# Patient Record
Sex: Male | Born: 1955 | Race: Black or African American | Hispanic: No | Marital: Married | Smoking: Never smoker
Health system: Southern US, Community
[De-identification: ages and names within clinical notes are randomized; demographics above are authoritative.]

## PROBLEM LIST (undated history)

## (undated) DIAGNOSIS — I10 Essential (primary) hypertension: Secondary | ICD-10-CM

---

## 2014-02-17 ENCOUNTER — Other Ambulatory Visit (HOSPITAL_COMMUNITY): Payer: Self-pay | Admitting: Internal Medicine

## 2014-02-17 ENCOUNTER — Ambulatory Visit
Admission: RE | Admit: 2014-02-17 | Discharge: 2014-02-17 | Disposition: A | Discharge status: Home / Self Care | Payer: BC Managed Care – PPO | Source: Ambulatory Visit | Attending: Internal Medicine | Admitting: Internal Medicine

## 2014-02-17 ENCOUNTER — Other Ambulatory Visit: Payer: Self-pay | Admitting: Internal Medicine

## 2014-02-17 DIAGNOSIS — R109 Unspecified abdominal pain: Secondary | ICD-10-CM

## 2014-02-17 DIAGNOSIS — R1084 Generalized abdominal pain: Secondary | ICD-10-CM

## 2014-02-17 DIAGNOSIS — K759 Inflammatory liver disease, unspecified: Secondary | ICD-10-CM

## 2014-02-17 DIAGNOSIS — R7989 Other specified abnormal findings of blood chemistry: Secondary | ICD-10-CM

## 2014-02-17 MED ORDER — IOHEXOL 300 MG/ML  SOLN
100.0000 mL | Freq: Once | INTRAMUSCULAR | Status: AC | PRN
  Administered 2014-02-17: 100 mL via INTRAVENOUS

## 2014-02-18 ENCOUNTER — Ambulatory Visit (HOSPITAL_COMMUNITY)
Admission: RE | Admit: 2014-02-18 | Discharge: 2014-02-18 | Disposition: A | Discharge status: Home / Self Care | Payer: BC Managed Care – PPO | Source: Ambulatory Visit | Attending: Internal Medicine | Admitting: Internal Medicine

## 2014-02-18 ENCOUNTER — Ambulatory Visit (HOSPITAL_COMMUNITY): Payer: Self-pay

## 2014-02-18 ENCOUNTER — Ambulatory Visit (HOSPITAL_COMMUNITY): Payer: BC Managed Care – PPO

## 2014-02-18 DIAGNOSIS — K7689 Other specified diseases of liver: Secondary | ICD-10-CM | POA: Insufficient documentation

## 2014-02-18 DIAGNOSIS — R109 Unspecified abdominal pain: Secondary | ICD-10-CM | POA: Insufficient documentation

## 2014-02-18 DIAGNOSIS — N281 Cyst of kidney, acquired: Secondary | ICD-10-CM | POA: Insufficient documentation

## 2014-02-18 DIAGNOSIS — N200 Calculus of kidney: Secondary | ICD-10-CM | POA: Insufficient documentation

## 2014-02-18 DIAGNOSIS — R7989 Other specified abnormal findings of blood chemistry: Secondary | ICD-10-CM

## 2014-02-18 DIAGNOSIS — K759 Inflammatory liver disease, unspecified: Secondary | ICD-10-CM

## 2014-12-26 IMAGING — US US ABDOMEN COMPLETE
1 series · 13 of 25 positions shown · non-contrast
Comparison: 02/17/2014

CLINICAL DATA: Abdominal pain, hepatitis

EXAM:
ULTRASOUND ABDOMEN COMPLETE

[Series 1: us abdomen complete · 0.24mm/px · 13 of 114 slices shown]
[im 1/114]
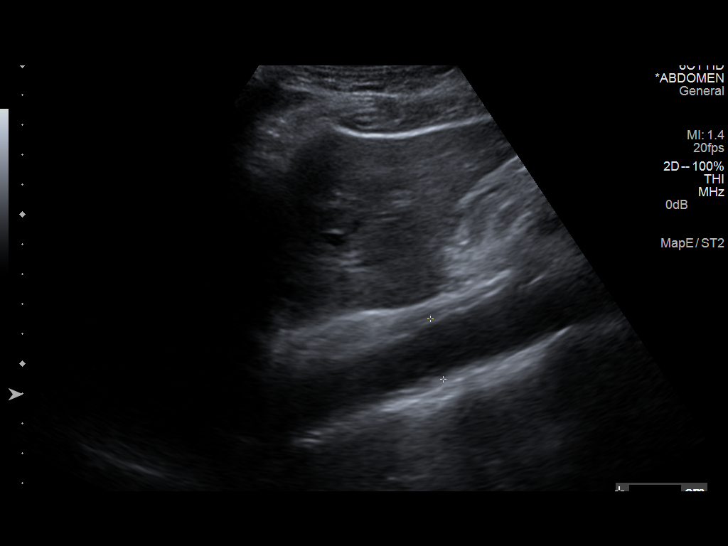
[im 10/114]
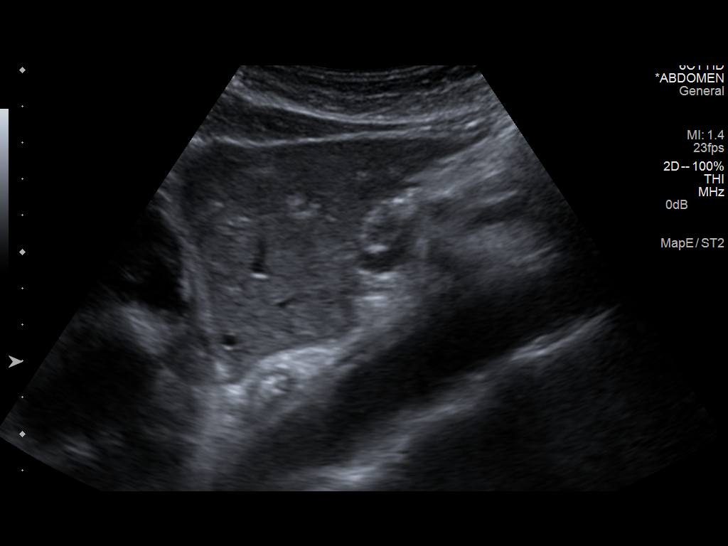
[im 19/114]
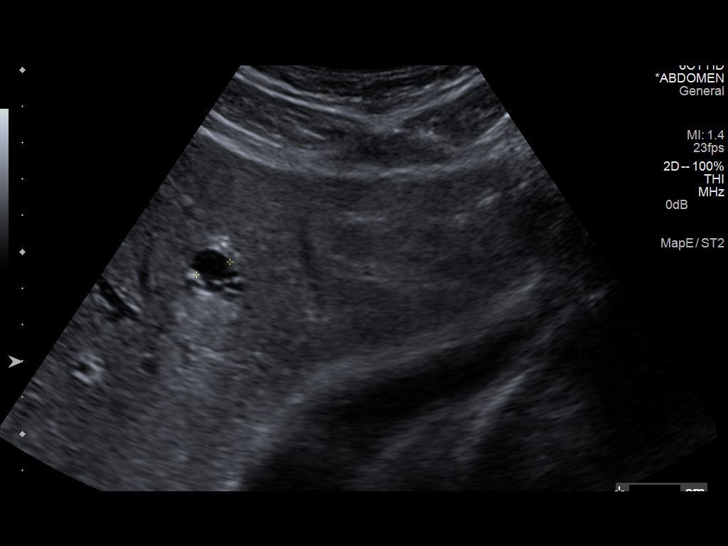
[im 29/114]
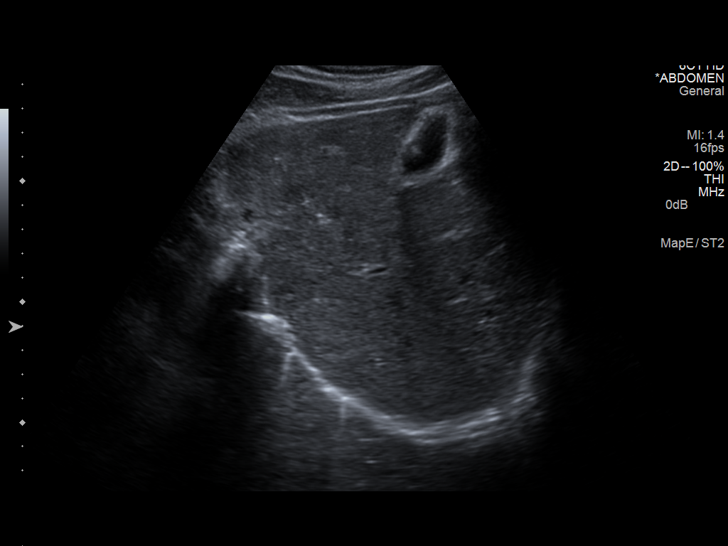
[im 38/114]
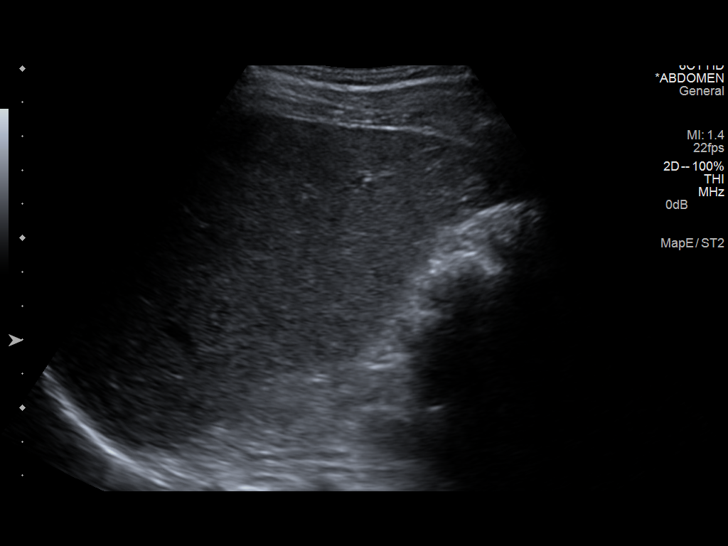
[im 48/114]
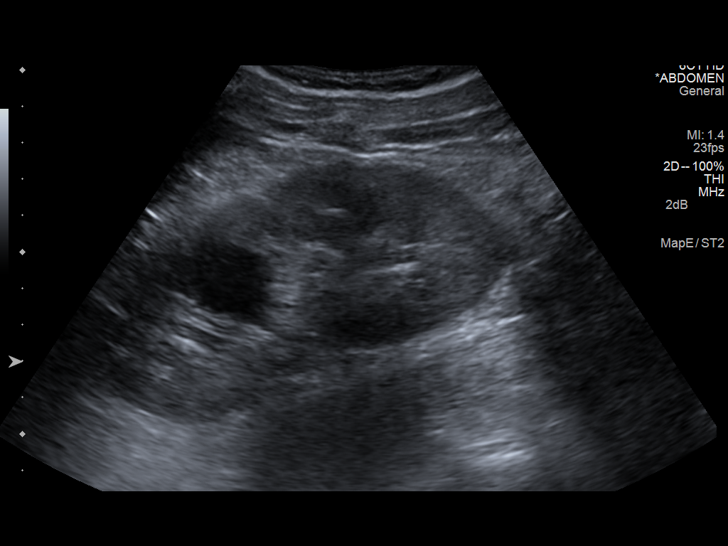
[im 57/114]
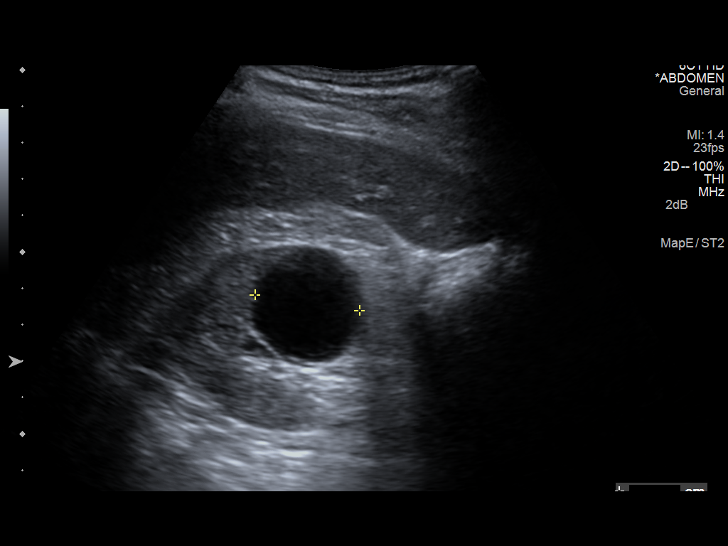
[im 66/114]
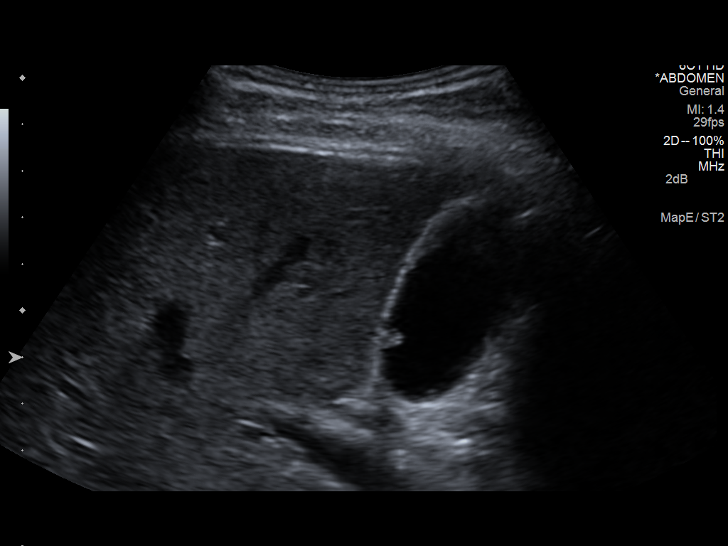
[im 76/114]
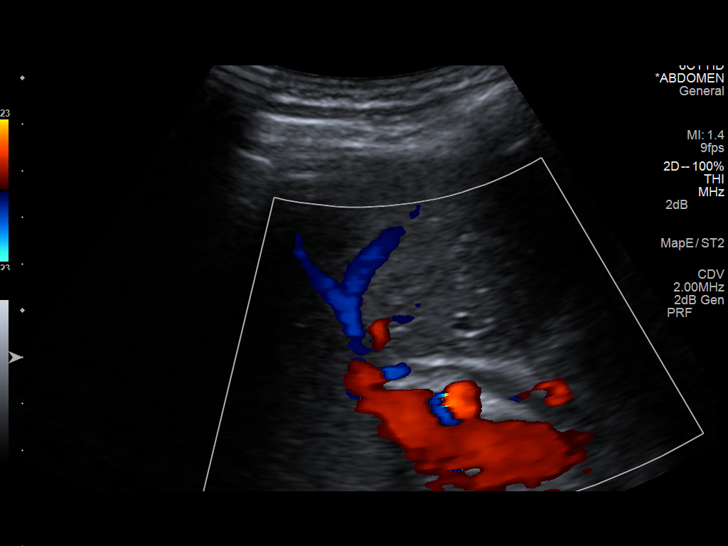
[im 85/114]
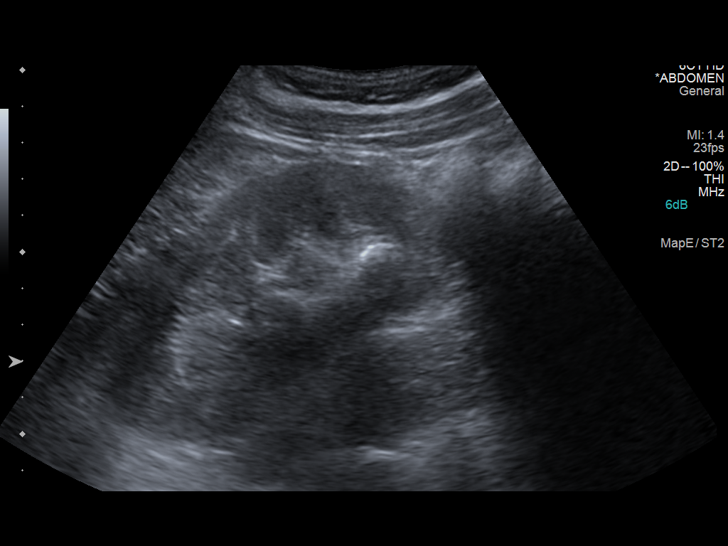
[im 95/114]
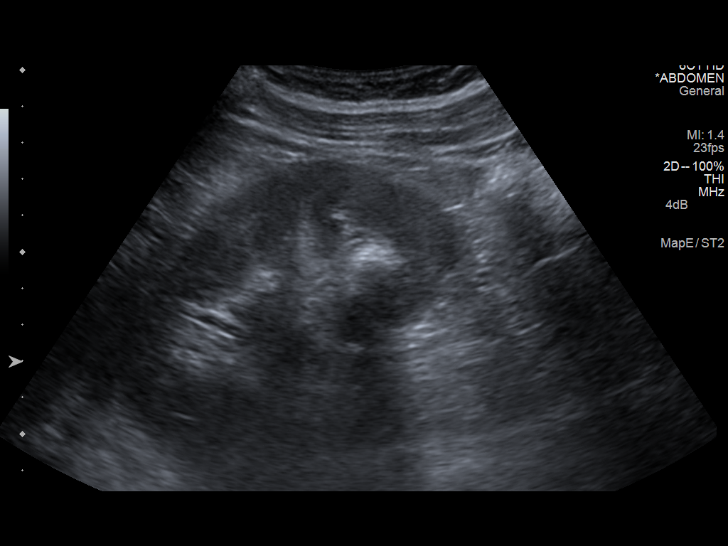
[im 104/114]
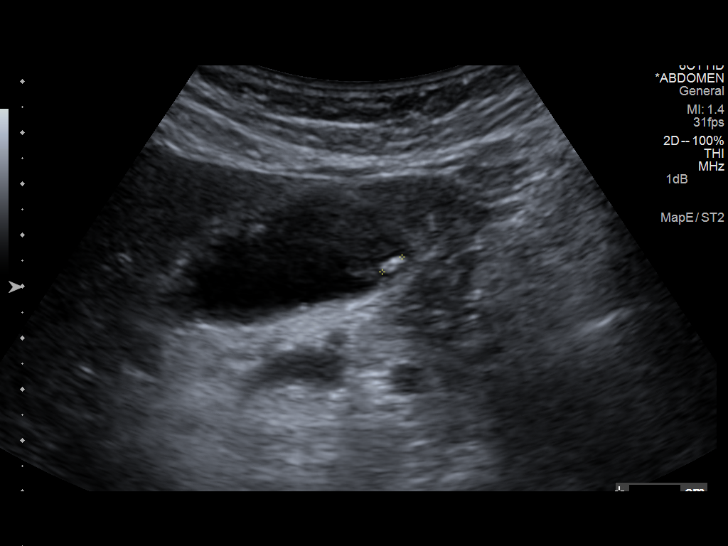
[im 114/114]
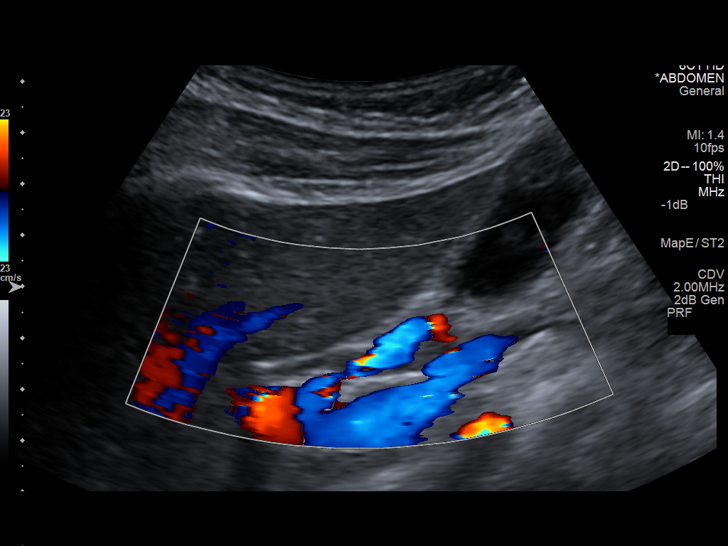

[13 of 25 positions shown; findings below may reference images not displayed]

FINDINGS: Gallbladder:

Incidental echogenic nonshadowing gallbladder polyps noted. Also,
there are small gallstones measuring 5 mm or less in size. Wall
thickness normal measuring 2.1 mm. No Murphy's sign or evidence of
acute cholecystitis by ultrasound.

Common bile duct:

Diameter: 5 mm

Liver:

Scattered hepatic hypoechoic cysts noted, largest measures 1.3 cm in
the left hepatic lobe. No biliary dilatation.

IVC:

No abnormality visualized.

Pancreas:

Visualized portion unremarkable.

Spleen:

Size and appearance within normal limits.

Right Kidney:

Length: 11.8 cm. Right upper pole hypoechoic renal cyst noted
measures 3.4 cm. No renal obstruction or hydronephrosis. No other
focal abnormality. Normal echogenicity.

Left Kidney:

Length: 11.4 cm . Echogenicity within normal limits. No mass or
hydronephrosis visualized. Left kidney lower pole nonobstructing
shadowing renal calculus noted measuring 11 mm, stable compared to
the prior CT.

Abdominal aorta:

No aneurysm visualized.

Other findings:

None.
IMPRESSION: Incidental gallbladder polyps and small gallstones. No ultrasound
evidence of acute cholecystitis.

No biliary dilatation

Hepatic and right renal cysts

Left nephrolithiasis

## 2016-05-06 DIAGNOSIS — N390 Urinary tract infection, site not specified: Secondary | ICD-10-CM | POA: Diagnosis not present

## 2016-05-06 DIAGNOSIS — Z0000 Encounter for general adult medical examination without abnormal findings: Secondary | ICD-10-CM | POA: Diagnosis not present

## 2016-05-13 DIAGNOSIS — Z0000 Encounter for general adult medical examination without abnormal findings: Secondary | ICD-10-CM | POA: Diagnosis not present

## 2016-05-13 DIAGNOSIS — I10 Essential (primary) hypertension: Secondary | ICD-10-CM | POA: Diagnosis not present

## 2016-08-26 DIAGNOSIS — M1712 Unilateral primary osteoarthritis, left knee: Secondary | ICD-10-CM | POA: Diagnosis not present

## 2016-08-26 DIAGNOSIS — M1711 Unilateral primary osteoarthritis, right knee: Secondary | ICD-10-CM | POA: Diagnosis not present

## 2017-06-02 DIAGNOSIS — I10 Essential (primary) hypertension: Secondary | ICD-10-CM | POA: Diagnosis not present

## 2017-06-02 DIAGNOSIS — Z0000 Encounter for general adult medical examination without abnormal findings: Secondary | ICD-10-CM | POA: Diagnosis not present

## 2017-06-02 DIAGNOSIS — Z125 Encounter for screening for malignant neoplasm of prostate: Secondary | ICD-10-CM | POA: Diagnosis not present

## 2017-06-09 DIAGNOSIS — Z0000 Encounter for general adult medical examination without abnormal findings: Secondary | ICD-10-CM | POA: Diagnosis not present

## 2017-06-09 DIAGNOSIS — I10 Essential (primary) hypertension: Secondary | ICD-10-CM | POA: Diagnosis not present

## 2018-03-30 DIAGNOSIS — M791 Myalgia, unspecified site: Secondary | ICD-10-CM | POA: Diagnosis not present

## 2018-06-08 DIAGNOSIS — Z125 Encounter for screening for malignant neoplasm of prostate: Secondary | ICD-10-CM | POA: Diagnosis not present

## 2018-06-08 DIAGNOSIS — I10 Essential (primary) hypertension: Secondary | ICD-10-CM | POA: Diagnosis not present

## 2018-06-08 DIAGNOSIS — Z0000 Encounter for general adult medical examination without abnormal findings: Secondary | ICD-10-CM | POA: Diagnosis not present

## 2018-07-16 DIAGNOSIS — Z23 Encounter for immunization: Secondary | ICD-10-CM | POA: Diagnosis not present

## 2018-07-16 DIAGNOSIS — Z0000 Encounter for general adult medical examination without abnormal findings: Secondary | ICD-10-CM | POA: Diagnosis not present

## 2018-07-16 DIAGNOSIS — M25569 Pain in unspecified knee: Secondary | ICD-10-CM | POA: Diagnosis not present

## 2018-07-16 DIAGNOSIS — I10 Essential (primary) hypertension: Secondary | ICD-10-CM | POA: Diagnosis not present

## 2018-07-16 DIAGNOSIS — F5101 Primary insomnia: Secondary | ICD-10-CM | POA: Diagnosis not present

## 2018-07-16 DIAGNOSIS — R002 Palpitations: Secondary | ICD-10-CM | POA: Diagnosis not present

## 2018-07-16 DIAGNOSIS — M25521 Pain in right elbow: Secondary | ICD-10-CM | POA: Diagnosis not present

## 2018-07-19 ENCOUNTER — Ambulatory Visit (HOSPITAL_COMMUNITY)
Admission: EM | Admit: 2018-07-19 | Discharge: 2018-07-19 | Disposition: A | Discharge status: Home / Self Care | Payer: BLUE CROSS/BLUE SHIELD

## 2018-07-19 ENCOUNTER — Encounter (HOSPITAL_COMMUNITY): Payer: Self-pay | Admitting: Emergency Medicine

## 2018-07-19 DIAGNOSIS — G4700 Insomnia, unspecified: Secondary | ICD-10-CM | POA: Diagnosis not present

## 2018-07-19 DIAGNOSIS — F419 Anxiety disorder, unspecified: Secondary | ICD-10-CM

## 2018-07-19 HISTORY — DX: Essential (primary) hypertension: I10

## 2018-07-19 MED ORDER — LORAZEPAM 0.5 MG PO TABS: 0.5000 mg | ORAL_TABLET | Freq: Every day | ORAL | 0 refills | Status: AC

## 2018-07-19 NOTE — Discharge Instructions (Addendum)
I am writing you some ativan to help with anxiety, and sleep at night. Please refrain from caffeine, alcohol for now. Avoid Tv, reading prior to bedtime. Deep breathing exercises. Keep diary of your symptoms, documenting possible triggers. Follow up with PCP for further evaluation and management needed. If experiencing chest pain, shortness of breath, palpitations, weakness, dizziness, confusion, suicidal or homicidal thoughts, go to the emergency department for further evaluation needed.

## 2018-07-19 NOTE — ED Triage Notes (Signed)
Pt c/o insomnia and anxiety for the last week.

## 2018-07-19 NOTE — ED Provider Notes (Addendum)
[facility]    CSN: [id number] Arrival date & time: 07/19/18  1132     History   Chief Complaint Chief Complaint  Patient presents with  . Insomnia  . Anxiety    HPI Steven Rollins is a 62 y.o. male.   62 year old male with history of HTN comes in with 5 day history of insomnia and anxiety. States first started with insomnia, where he has trouble falling asleep, but also trouble staying asleep.  He was set had episodes of anxiety, where his mind is racing, and feels agitated.  Usually, he finds himself pacing or jogging in the same spot to help with the symptoms.  He saw his PCP 4 days ago for an annual visit, and at that time was recommended to take Tylenol PM.  States labs for annual visit was drawn 06/08/2018, with normal results, specifically was normal TSH.  He has tried Tylenol PM, Benadryl with improvement of falling asleep, but still having trouble staying asleep.  He wakes up every 1-2 hours after falling asleep.  States now with anxiety during the day, where he has episodes of agitation during work, and needing to take breaks.  He has had remote history of anxiety, states had similar episodes in the past, but never this frequent, and never persistent.  He has never had to take medications for the symptoms.  States apart from having URI symptoms about a week ago, and feeling "not himself" after taking some cold medication, has not had any changes in life.  Denies increased stress.  Denies chest pain, shortness of breath, wheezing, palpitation.  Denies weakness, dizziness, syncope. Denies fever, chills, night sweats. Denies abdominal pain, nausea, vomiting. States apart from his racing thoughts, and agitation, his " body feels fine".  Never smoker.  Drinks 1 cup of coffee in the morning, has not increased amounts.  Rare alcohol use.  Denies illicit drug use.  Denies suicidal or homicidal ideation.  Patient's spouse states apart from pacing, and expressing anxiety, patient  has not exhibited any confusion, altered mental status, hallucinations.     Past Medical History:  Diagnosis Date  . Hypertension     There are no active problems to display for this patient.   History reviewed. No pertinent surgical history.     Home Medications    Prior to Admission medications   Medication Sig Start Date End Date Taking? Authorizing Provider  Chlorphen-Pseudoephed-APAP (CORICIDIN D PO) Take by mouth.   Yes [provider]  Fluticasone-Salmeterol (ADVAIR) 250-50 MCG/DOSE AEPB Inhale 1 puff into the lungs 2 (two) times daily.   Yes [provider]  amLODipine (NORVASC) 10 MG tablet Take 10 mg by mouth daily. 05/16/18   [provider]  LORazepam (ATIVAN) 0.5 MG tablet Take 1 tablet (0.5 mg total) by mouth at bedtime. 07/19/18   Cathie HoopsYu, Kiran Carline V, PA-C  triamterene-hydrochlorothiazide (DYAZIDE) 37.5-25 MG capsule Take 1 capsule by mouth every morning. 06/16/18   [provider]    Family History No family history on file.  Social History Social History   Tobacco Use  . Smoking status: Never Smoker  Substance Use Topics  . Alcohol use: Yes  . Drug use: Not on file     Allergies   Lisinopril   Review of Systems Review of Systems  Reason unable to perform ROS: See HPI as above.     Physical Exam Triage Vital Signs ED Triage Vitals  Enc Vitals Group     BP  07/19/18 1228 (!) 147/89     Pulse Rate 07/19/18 1228 74     Resp 07/19/18 1228 18     Temp 07/19/18 1228 98 F (36.7 C)     Temp src --      SpO2 07/19/18 1228 99 %     Weight --      Height --      Head Circumference --      Peak Flow --      Pain Score 07/19/18 1230 0     Pain Loc --      Pain Edu? --      Excl. in GC? --    No data found.  Updated Vital Signs BP (!) 147/89   Pulse 74   Temp 98 F (36.7 C)   Resp 18   SpO2 99%   Physical Exam  Constitutional: He is oriented to person, place, and time. He appears well-developed and  well-nourished. No distress.  HENT:  Head: Normocephalic and atraumatic.  Right Ear: Tympanic membrane, external ear and ear canal normal. Tympanic membrane is not erythematous and not bulging.  Left Ear: Tympanic membrane, external ear and ear canal normal. Tympanic membrane is not erythematous and not bulging.  Nose: Nose normal. Right sinus exhibits no maxillary sinus tenderness and no frontal sinus tenderness. Left sinus exhibits no maxillary sinus tenderness and no frontal sinus tenderness.  Mouth/Throat: Uvula is midline, oropharynx is clear and moist and mucous membranes are normal.  Eyes: Pupils are equal, round, and reactive to light. Conjunctivae are normal.  Neck: Normal range of motion. Neck supple.  Cardiovascular: Normal rate, regular rhythm and normal heart sounds. Exam reveals no gallop and no friction rub.  No murmur heard. Pulmonary/Chest: Effort normal and breath sounds normal. No stridor. No respiratory distress. He has no decreased breath sounds. He has no wheezes. He has no rhonchi. He has no rales.  Abdominal: Soft. Bowel sounds are normal. There is no tenderness. There is no rebound and no guarding.  Lymphadenopathy:    He has no cervical adenopathy.  Neurological: He is alert and oriented to person, place, and time. Coordination and gait normal. GCS eye subscore is 4. GCS verbal subscore is 5. GCS motor subscore is 6.  Skin: Skin is warm and dry.  Psychiatric: He has a normal mood and affect. His behavior is normal. Judgment and thought content normal. He is not actively hallucinating. Cognition and memory are normal. He expresses no suicidal plans and no homicidal plans.  Pacing in room. Otherwise, good eye contact. Speech normal. Does not appear to be anxious or agitated.    UC Treatments / Results  Labs (all labs ordered are listed, but only abnormal results are displayed) Labs Reviewed - No data to display  EKG None  Radiology No results  found.  Procedures Procedures (including critical care time)  Medications Ordered in UC Medications - No data to display  Initial Impression / Assessment and Plan / UC Course  I have reviewed the triage vital signs and the nursing notes.  Pertinent labs & imaging results that were available during my care of the patient were reviewed by me and considered in my medical decision making (see chart for details).     Physical exam unremarkable. Patient with good eye contact, no pressured or rapid speech. Without obvious agitation or anxiety, though pacing in the room. Memory intact and able to express himself without difficulty. He denies SI/HI. Recent lab within normal limits. Will provide  short course of ativan at night to help with sleep and anxiety. Avoid caffeine, alcohol, smoking. Patient to follow up with PCP for further evaluation needed. Return precautions given. Patient expresses understanding and agrees to plan.   Final Clinical Impressions(s) / UC Diagnoses   Final diagnoses:  Anxiety  Insomnia, unspecified type    ED Prescriptions    Medication Sig Dispense Auth. Provider   LORazepam (ATIVAN) 0.5 MG tablet Take 1 tablet (0.5 mg total) by mouth at bedtime. 15 tablet Belinda Fisher, PA-C     Controlled Substance Prescriptions [place] Controlled Substance Registry consulted? Yes, I have consulted the [place] Controlled Substances Registry for this patient, and feel the risk/benefit ratio today is favorable for proceeding with this prescription for a controlled substance.   Belinda Fisher, PA-C 07/19/18 2140    Belinda Fisher, PA-C 07/19/18 2140

## 2018-07-20 DIAGNOSIS — F419 Anxiety disorder, unspecified: Secondary | ICD-10-CM | POA: Diagnosis not present

## 2018-07-20 DIAGNOSIS — G4700 Insomnia, unspecified: Secondary | ICD-10-CM | POA: Diagnosis not present

## 2018-08-03 DIAGNOSIS — I10 Essential (primary) hypertension: Secondary | ICD-10-CM | POA: Diagnosis not present

## 2018-08-03 DIAGNOSIS — F5101 Primary insomnia: Secondary | ICD-10-CM | POA: Diagnosis not present

## 2018-08-03 DIAGNOSIS — F419 Anxiety disorder, unspecified: Secondary | ICD-10-CM | POA: Diagnosis not present

## 2018-09-03 DIAGNOSIS — F419 Anxiety disorder, unspecified: Secondary | ICD-10-CM | POA: Diagnosis not present

## 2018-09-03 DIAGNOSIS — F5101 Primary insomnia: Secondary | ICD-10-CM | POA: Diagnosis not present

## 2019-06-29 DIAGNOSIS — M7989 Other specified soft tissue disorders: Secondary | ICD-10-CM | POA: Diagnosis not present

## 2019-06-29 DIAGNOSIS — M25521 Pain in right elbow: Secondary | ICD-10-CM | POA: Diagnosis not present

## 2019-07-15 DIAGNOSIS — M791 Myalgia, unspecified site: Secondary | ICD-10-CM | POA: Diagnosis not present

## 2019-07-15 DIAGNOSIS — N390 Urinary tract infection, site not specified: Secondary | ICD-10-CM | POA: Diagnosis not present

## 2019-07-15 DIAGNOSIS — I10 Essential (primary) hypertension: Secondary | ICD-10-CM | POA: Diagnosis not present

## 2019-07-15 DIAGNOSIS — Z0000 Encounter for general adult medical examination without abnormal findings: Secondary | ICD-10-CM | POA: Diagnosis not present

## 2019-07-15 DIAGNOSIS — Z125 Encounter for screening for malignant neoplasm of prostate: Secondary | ICD-10-CM | POA: Diagnosis not present

## 2019-07-22 DIAGNOSIS — M7712 Lateral epicondylitis, left elbow: Secondary | ICD-10-CM | POA: Diagnosis not present

## 2019-07-22 DIAGNOSIS — M25522 Pain in left elbow: Secondary | ICD-10-CM | POA: Diagnosis not present

## 2019-07-22 DIAGNOSIS — M150 Primary generalized (osteo)arthritis: Secondary | ICD-10-CM | POA: Diagnosis not present

## 2019-07-22 DIAGNOSIS — Z0000 Encounter for general adult medical examination without abnormal findings: Secondary | ICD-10-CM | POA: Diagnosis not present

## 2019-07-22 DIAGNOSIS — I10 Essential (primary) hypertension: Secondary | ICD-10-CM | POA: Diagnosis not present

## 2019-07-22 DIAGNOSIS — M25521 Pain in right elbow: Secondary | ICD-10-CM | POA: Diagnosis not present

## 2019-07-22 DIAGNOSIS — M7711 Lateral epicondylitis, right elbow: Secondary | ICD-10-CM | POA: Diagnosis not present

## 2019-07-22 DIAGNOSIS — F419 Anxiety disorder, unspecified: Secondary | ICD-10-CM | POA: Diagnosis not present

## 2019-07-26 DIAGNOSIS — M25632 Stiffness of left wrist, not elsewhere classified: Secondary | ICD-10-CM | POA: Diagnosis not present

## 2019-07-26 DIAGNOSIS — M25631 Stiffness of right wrist, not elsewhere classified: Secondary | ICD-10-CM | POA: Diagnosis not present

## 2019-07-26 DIAGNOSIS — M7711 Lateral epicondylitis, right elbow: Secondary | ICD-10-CM | POA: Diagnosis not present

## 2019-07-26 DIAGNOSIS — M25521 Pain in right elbow: Secondary | ICD-10-CM | POA: Diagnosis not present

## 2019-10-04 ENCOUNTER — Ambulatory Visit: Payer: BC Managed Care – PPO | Attending: Family

## 2019-10-04 DIAGNOSIS — Z23 Encounter for immunization: Secondary | ICD-10-CM

## 2019-10-04 NOTE — Progress Notes (Signed)
   Covid-19 Vaccination Clinic  Name:  Steven Rollins    MRN: [id number] DOB: 1955/08/16  10/04/2019  Steven Rollins was observed post Covid-19 immunization for 15 minutes without incidence. He was provided with Vaccine Information Sheet and instruction to access the V-Safe system.   Steven Rollins was instructed to call 911 with any severe reactions post vaccine: Marland Kitchen Difficulty breathing  . Swelling of your face and throat  . A fast heartbeat  . A bad rash all over your body  . Dizziness and weakness    Immunizations Administered    Name Date Dose VIS Date Route   Moderna COVID-19 Vaccine 10/04/2019 12:25 PM 0.5 mL 07/13/2019 Intramuscular   Manufacturer: Moderna   Lot: [id number]   NDC: 44584-835-07

## 2019-11-02 ENCOUNTER — Ambulatory Visit: Payer: BC Managed Care – PPO | Attending: Family

## 2019-11-02 DIAGNOSIS — Z23 Encounter for immunization: Secondary | ICD-10-CM

## 2019-11-02 NOTE — Progress Notes (Signed)
   Covid-19 Vaccination Clinic  Name:  Steven Rollins    MRN: [id number] DOB: 18-May-1956  11/02/2019  Mr. Steven Rollins was observed post Covid-19 immunization for 15 minutes without incident. He was provided with Vaccine Information Sheet and instruction to access the V-Safe system.   Mr. Steven Rollins was instructed to call 911 with any severe reactions post vaccine: Marland Kitchen Difficulty breathing  . Swelling of face and throat  . A fast heartbeat  . A bad rash all over body  . Dizziness and weakness   Immunizations Administered    Name Date Dose VIS Date Route   Moderna COVID-19 Vaccine 11/02/2019  3:22 PM 0.5 mL 07/13/2019 Intramuscular   Manufacturer: Moderna   Lot: [id number]   NDC: 79150-413-64

## 2019-12-20 DIAGNOSIS — M1712 Unilateral primary osteoarthritis, left knee: Secondary | ICD-10-CM | POA: Diagnosis not present

## 2019-12-20 DIAGNOSIS — M1711 Unilateral primary osteoarthritis, right knee: Secondary | ICD-10-CM | POA: Diagnosis not present

## 2020-07-19 DIAGNOSIS — Z0000 Encounter for general adult medical examination without abnormal findings: Secondary | ICD-10-CM | POA: Diagnosis not present

## 2020-07-19 DIAGNOSIS — Z125 Encounter for screening for malignant neoplasm of prostate: Secondary | ICD-10-CM | POA: Diagnosis not present

## 2020-07-19 DIAGNOSIS — I10 Essential (primary) hypertension: Secondary | ICD-10-CM | POA: Diagnosis not present

## 2020-07-27 DIAGNOSIS — Z0000 Encounter for general adult medical examination without abnormal findings: Secondary | ICD-10-CM | POA: Diagnosis not present

## 2020-07-27 DIAGNOSIS — M150 Primary generalized (osteo)arthritis: Secondary | ICD-10-CM | POA: Diagnosis not present

## 2020-07-27 DIAGNOSIS — I10 Essential (primary) hypertension: Secondary | ICD-10-CM | POA: Diagnosis not present

## 2020-07-27 DIAGNOSIS — F5101 Primary insomnia: Secondary | ICD-10-CM | POA: Diagnosis not present

## 2020-08-10 DIAGNOSIS — H40033 Anatomical narrow angle, bilateral: Secondary | ICD-10-CM | POA: Diagnosis not present

## 2020-10-10 DIAGNOSIS — H40039 Anatomical narrow angle, unspecified eye: Secondary | ICD-10-CM | POA: Diagnosis not present

## 2020-10-20 DIAGNOSIS — H04123 Dry eye syndrome of bilateral lacrimal glands: Secondary | ICD-10-CM | POA: Diagnosis not present

## 2020-10-20 DIAGNOSIS — H2513 Age-related nuclear cataract, bilateral: Secondary | ICD-10-CM | POA: Diagnosis not present

## 2020-10-20 DIAGNOSIS — I10 Essential (primary) hypertension: Secondary | ICD-10-CM | POA: Diagnosis not present

## 2020-10-20 DIAGNOSIS — H40013 Open angle with borderline findings, low risk, bilateral: Secondary | ICD-10-CM | POA: Diagnosis not present

## 2020-11-11 ENCOUNTER — Other Ambulatory Visit: Payer: Self-pay

## 2020-11-11 ENCOUNTER — Ambulatory Visit (HOSPITAL_COMMUNITY)
Admission: EM | Admit: 2020-11-11 | Discharge: 2020-11-11 | Disposition: A | Discharge status: Home / Self Care | Payer: BC Managed Care – PPO | Attending: Medical Oncology | Admitting: Medical Oncology

## 2020-11-11 ENCOUNTER — Encounter (HOSPITAL_COMMUNITY): Payer: Self-pay | Admitting: *Deleted

## 2020-11-11 DIAGNOSIS — Z7951 Long term (current) use of inhaled steroids: Secondary | ICD-10-CM | POA: Diagnosis not present

## 2020-11-11 DIAGNOSIS — L42 Pityriasis rosea: Secondary | ICD-10-CM | POA: Insufficient documentation

## 2020-11-11 DIAGNOSIS — I10 Essential (primary) hypertension: Secondary | ICD-10-CM | POA: Insufficient documentation

## 2020-11-11 DIAGNOSIS — Z79899 Other long term (current) drug therapy: Secondary | ICD-10-CM | POA: Diagnosis not present

## 2020-11-11 MED ORDER — HYDROXYZINE HCL 25 MG PO TABS: 25.0000 mg | ORAL_TABLET | Freq: Four times a day (QID) | ORAL | 0 refills | Status: AC

## 2020-11-11 NOTE — ED Triage Notes (Signed)
Pt reports rash started 3 weeks ago and has now spread to face and neck.

## 2020-11-11 NOTE — ED Provider Notes (Signed)
[facility]    CSN: [id number] Arrival date & time: 11/11/20  1002      History   Chief Complaint Chief Complaint  Patient presents with  . Rash    HPI Steven Rollins is a 65 y.o. male.   HPI   Rash: Pt states that on the 7th of last month he started some new supplements: B complex, fenugreek. He also started applying iodine to a area of redness of his toe. On the 14th he stopped these supplements as on the 10th he started having a bumpy rash of his face. The rash is slightly itchy and started out as a few papules behind his ear. This has slowly spread. On the 22nd he stopped the iodine. The rash has now appeared to be stable. Flat red/purple patches of his chest and back along with papules and similar rash of his face and scalp. He does state that in late January he had COVID-19. No other recent colds. No new sexual partners. He mentions having some dry eye sensation at times but no significant changes in vision or neuro changes. No trouble breathing ro swallowing. Rash itching is slightly improved with zyrtec.   Past Medical History:  Diagnosis Date  . Hypertension     There are no problems to display for this patient.   History reviewed. No pertinent surgical history.     Home Medications    Prior to Admission medications   Medication Sig Start Date End Date Taking? Authorizing Provider  amLODipine (NORVASC) 10 MG tablet Take 10 mg by mouth daily. 05/16/18   [provider]  Chlorphen-Pseudoephed-APAP (CORICIDIN D PO) Take by mouth.    [provider]  Fluticasone-Salmeterol (ADVAIR) 250-50 MCG/DOSE AEPB Inhale 1 puff into the lungs 2 (two) times daily.    [provider]  LORazepam (ATIVAN) 0.5 MG tablet Take 1 tablet (0.5 mg total) by mouth at bedtime. 07/19/18   Cathie Hoops, Amy V, PA-C  triamterene-hydrochlorothiazide (DYAZIDE) 37.5-25 MG capsule Take 1 capsule by mouth every morning. 06/16/18   [provider]    Family  History History reviewed. No pertinent family history.  Social History Social History   Tobacco Use  . Smoking status: Never Smoker  . Smokeless tobacco: Never Used  Substance Use Topics  . Alcohol use: Yes     Allergies   Lisinopril   Review of Systems Review of Systems  As stated above in HPI Physical Exam Triage Vital Signs ED Triage Vitals  Enc Vitals Group     BP 11/11/20 1019 (!) 187/107     Pulse Rate 11/11/20 1019 83     Resp 11/11/20 1019 18     Temp 11/11/20 1019 98 F (36.7 C)     Temp Source 11/11/20 1019 Oral     SpO2 11/11/20 1019 97 %     Weight --      Height --      Head Circumference --      Peak Flow --      Pain Score 11/11/20 1018 2     Pain Loc --      Pain Edu? --      Excl. in GC? --    No data found.  Updated Vital Signs BP (!) 187/107 (BP Location: Left Arm)   Pulse 83   Temp 98 F (36.7 C) (Oral)   Resp 18   SpO2 97%    Physical Exam Vitals and nursing note reviewed.  Constitutional:  Appearance: Normal appearance.  Cardiovascular:     Rate and Rhythm: Normal rate and regular rhythm.     Heart sounds: Normal heart sounds.  Pulmonary:     Effort: Pulmonary effort is normal.     Breath sounds: Normal breath sounds.  Skin:    Comments: Small papules of the face with scattered flat purple lesions with fine scale. Larger (roughly 1 cm) purple lesions of trunk with fine scale. There are scattered brown to macules of the palms of hands.   Neurological:     Mental Status: He is alert.      UC Treatments / Results  Labs (all labs ordered are listed, but only abnormal results are displayed) Labs Reviewed - No data to display  EKG   Radiology No results found.  Procedures Procedures (including critical care time)  Medications Ordered in UC Medications - No data to display  Initial Impression / Assessment and Plan / UC Course  I have reviewed the triage vital signs and the nursing notes.  Pertinent labs &  imaging results that were available during my care of the patient were reviewed by me and considered in my medical decision making (see chart for details).     New. Discussed concerns for potential Syphilis. Pt agreeable to RPR testing. If negative this likely represents Pityriasis Rosea which we also discussed. He will take the Atarax OR the Zyrtec. Discussed. Discussed red flag signs and symptoms. We also discussed his BP which is elevated today. He will follow a low salt diet, complete home monitoring and follow up with his PCP next week or with the ER sooner as needed.    Final Clinical Impressions(s) / UC Diagnoses   Final diagnoses:  None   Discharge Instructions   None    ED Prescriptions    None     PDMP not reviewed this encounter.   Rushie Chestnut, [place] 11/11/20 1102

## 2020-11-12 LAB — RPR: RPR Ser Ql: NONREACTIVE
